# Patient Record
Sex: Female | Born: 2000 | Race: Black or African American | Hispanic: No | Marital: Single | Smoking: Never smoker
Health system: Southern US, Community
[De-identification: ages and names within clinical notes are randomized; demographics above are authoritative.]

## PROBLEM LIST (undated history)

## (undated) DIAGNOSIS — J45909 Unspecified asthma, uncomplicated: Secondary | ICD-10-CM

## (undated) HISTORY — PX: EYE SURGERY: SHX253

---
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
## 2022-09-12 IMAGING — CT CT HEAD W/O CM
4 series · 17 of 47 positions shown, 19 images · non-contrast
Comparison: None.

CLINICAL DATA: 20-year-old female status post trip and fall.
Blurred vision, tingling.

EXAM:
CT HEAD WITHOUT CONTRAST
TECHNIQUE: Contiguous axial images were obtained from the base of the skull
through the vertex without intravenous contrast.

[Series 3: head without · axial · non-contrast · 0.45mm/px · z∈[-97,+18]mm · 7 of 31 slices shown, 9 images]
[im 4/31  brain]
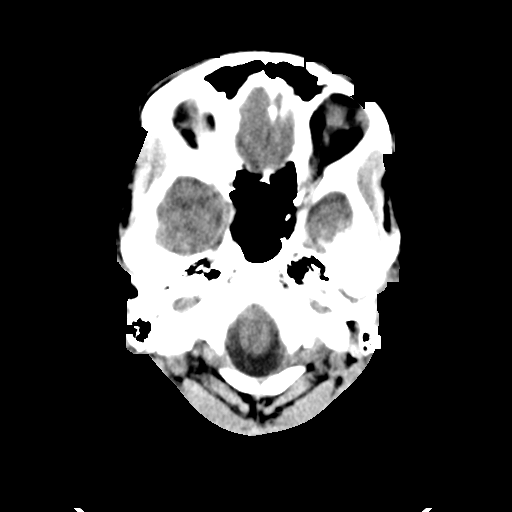
[im 4/31  bone]
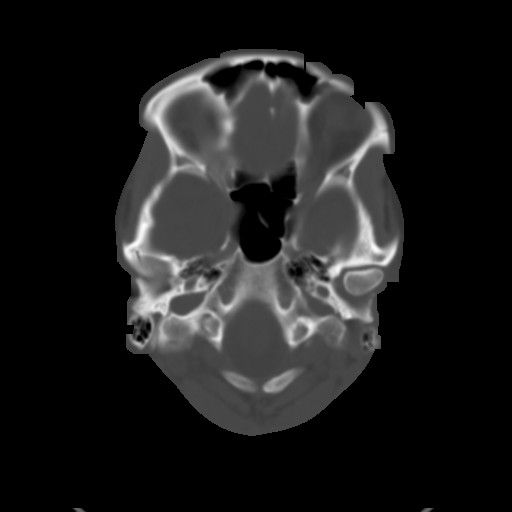
[im 8/31  brain]
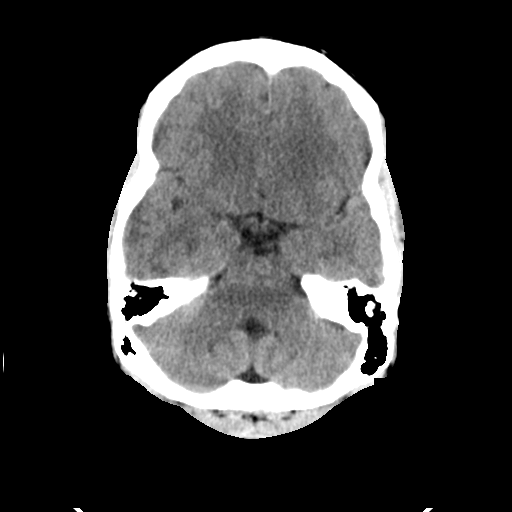
[im 12/31  brain]
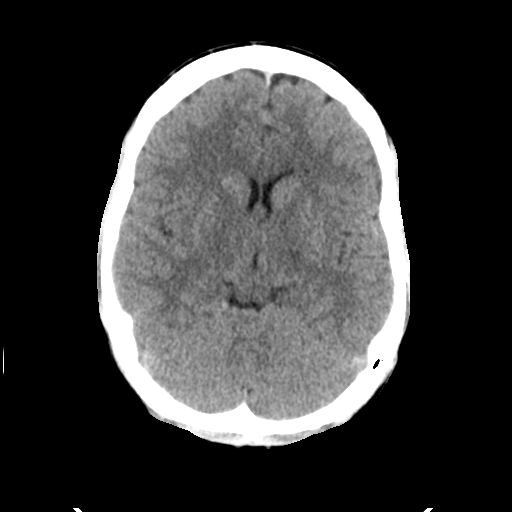
[im 16/31  brain]
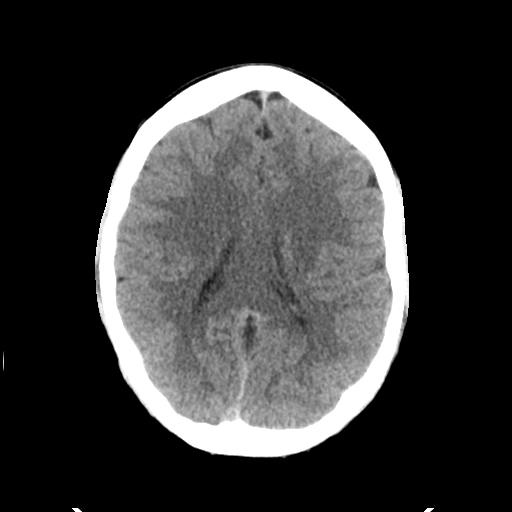
[im 19/31  brain]
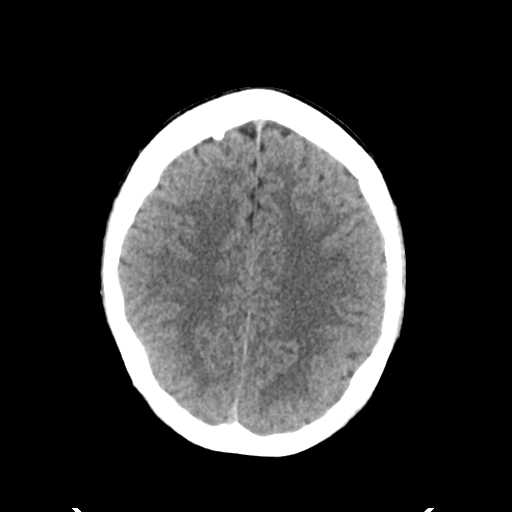
[im 19/31  bone]
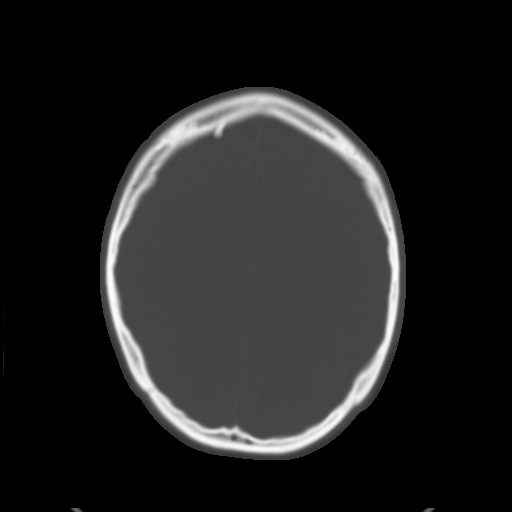
[im 23/31  brain]
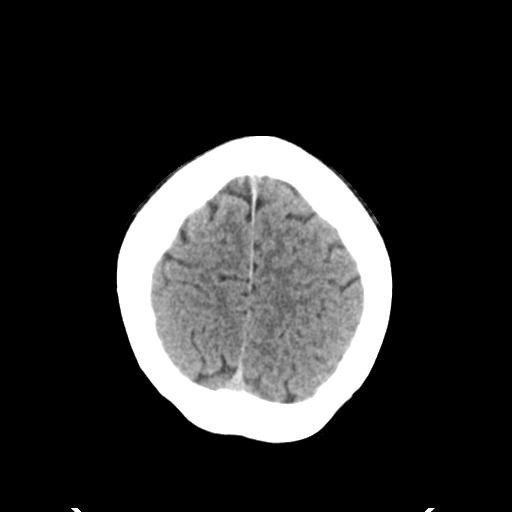
[im 27/31  brain]
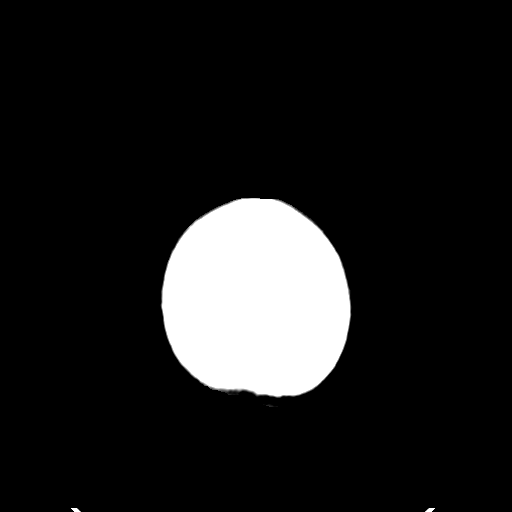

[Series 4: head bone · axial · 0.45mm/px · z∈[-98,-46]mm · 4 of 76 slices shown]
[im 8/76  bone]
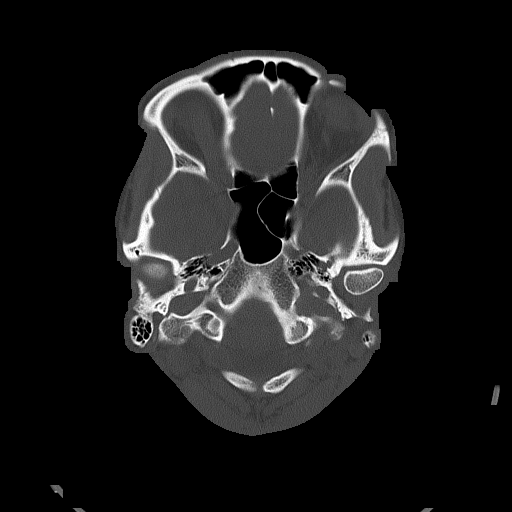
[im 16/76  bone]
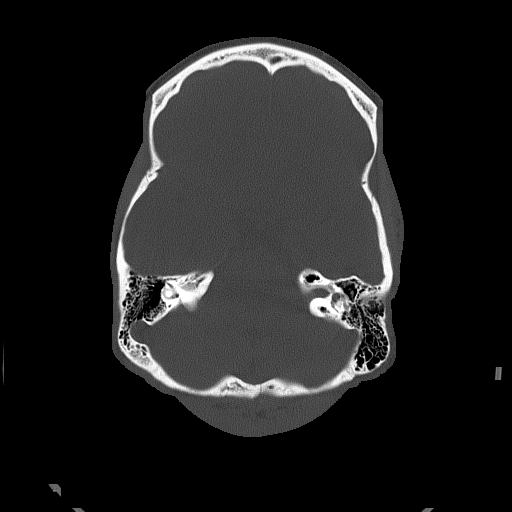
[im 23/76  bone]
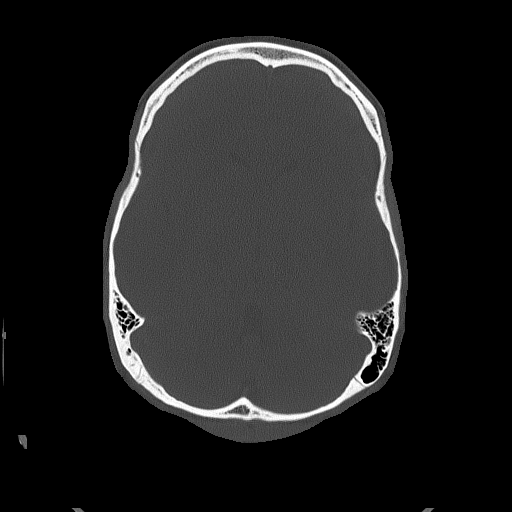
[im 34/76  bone]
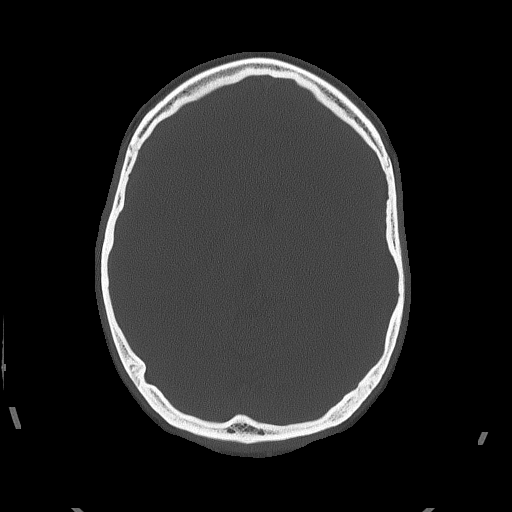

[Series 5: head without cor · coronal · non-contrast · 0.34mm/px · 3 of 67 slices shown]
[im 23/67  brain]
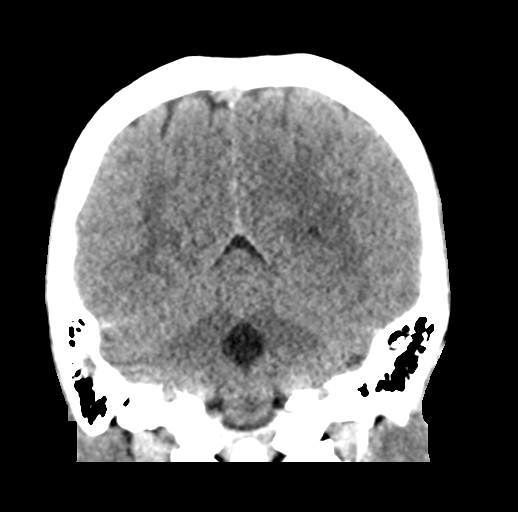
[im 30/67  brain]
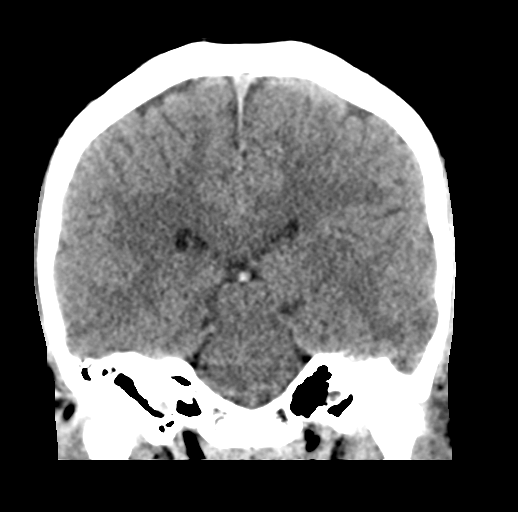
[im 37/67  brain]
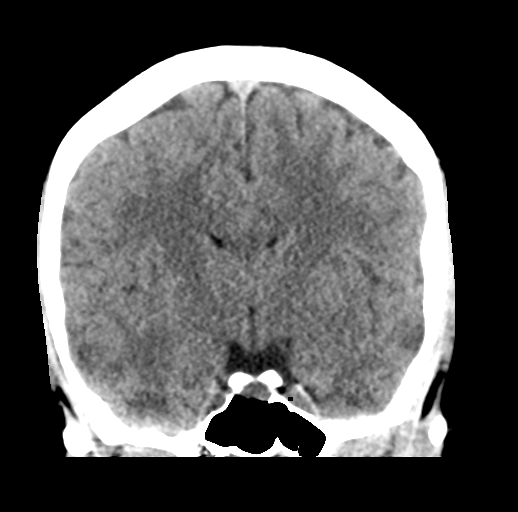

[Series 6: head without sag · sagittal · non-contrast · 0.34mm/px · 3 of 59 slices shown]
[im 20/59  brain]
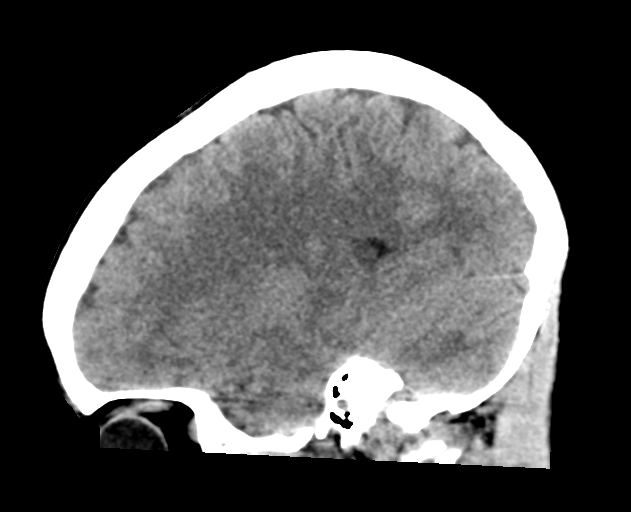
[im 30/59  brain]
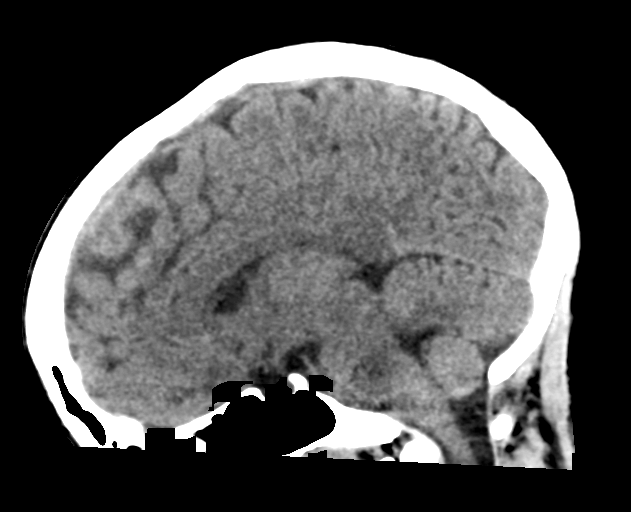
[im 39/59  brain]
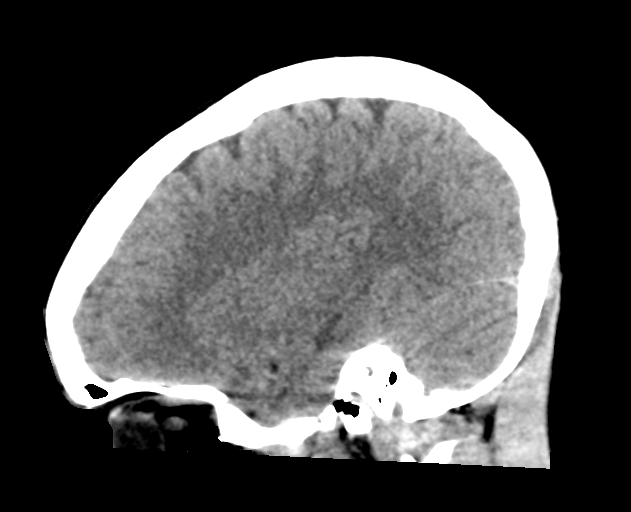

[17 of 47 positions shown; findings below may reference images not displayed]

FINDINGS: Brain: No midline shift, ventriculomegaly, mass effect, evidence of
mass lesion, intracranial hemorrhage or evidence of cortically based
acute infarction. Gray-white matter differentiation is within normal
limits throughout the brain.

Vascular: No suspicious intracranial vascular hyperdensity.

Skull: Skull appears intact, negative. Congenital incomplete
ossification of the posterior C1 ring, normal variant.

Sinuses/Orbits: Visualized paranasal sinuses and mastoids are clear.

Other: No orbit or scalp soft tissue injury identified.
IMPRESSION: Normal noncontrast Head CT.   No traumatic injury identified.
